# Patient Record
Sex: Female | Born: 1990 | Race: Black or African American | Hispanic: No | Smoking: Current every day smoker
Health system: Southern US, Community
[De-identification: ages and names within clinical notes are randomized; demographics above are authoritative.]

---

## 2014-01-09 ENCOUNTER — Emergency Department (HOSPITAL_COMMUNITY): Payer: No Typology Code available for payment source

## 2014-01-09 ENCOUNTER — Emergency Department (HOSPITAL_COMMUNITY)
Admission: EM | Admit: 2014-01-09 | Discharge: 2014-01-09 | Disposition: A | Discharge status: Home / Self Care | Payer: Self-pay | Attending: Emergency Medicine | Admitting: Emergency Medicine

## 2014-01-09 ENCOUNTER — Encounter (HOSPITAL_COMMUNITY): Payer: Self-pay | Admitting: Emergency Medicine

## 2014-01-09 DIAGNOSIS — Y998 Other external cause status: Secondary | ICD-10-CM | POA: Insufficient documentation

## 2014-01-09 DIAGNOSIS — Z720 Tobacco use: Secondary | ICD-10-CM | POA: Insufficient documentation

## 2014-01-09 DIAGNOSIS — S0083XA Contusion of other part of head, initial encounter: Secondary | ICD-10-CM | POA: Insufficient documentation

## 2014-01-09 DIAGNOSIS — Y92410 Unspecified street and highway as the place of occurrence of the external cause: Secondary | ICD-10-CM | POA: Insufficient documentation

## 2014-01-09 DIAGNOSIS — S3992XA Unspecified injury of lower back, initial encounter: Secondary | ICD-10-CM | POA: Insufficient documentation

## 2014-01-09 DIAGNOSIS — Y9389 Activity, other specified: Secondary | ICD-10-CM | POA: Insufficient documentation

## 2014-01-09 DIAGNOSIS — S199XXA Unspecified injury of neck, initial encounter: Secondary | ICD-10-CM | POA: Insufficient documentation

## 2014-01-09 MED ORDER — HYDROCODONE-ACETAMINOPHEN 5-325 MG PO TABS: 1.0000 | ORAL_TABLET | Freq: Four times a day (QID) | ORAL | Status: AC | PRN

## 2014-01-09 MED ORDER — SODIUM CHLORIDE 0.9 % IV BOLUS (SEPSIS)
1000.0000 mL | Freq: Once | INTRAVENOUS | Status: AC
Start: 2014-01-09 — End: 2014-01-09
  Administered 2014-01-09: 1000 mL via INTRAVENOUS

## 2014-01-09 MED ORDER — KETOROLAC TROMETHAMINE 30 MG/ML IJ SOLN
30.0000 mg | Freq: Once | INTRAMUSCULAR | Status: AC
  Administered 2014-01-09: 30 mg via INTRAVENOUS
  Filled 2014-01-09: qty 1

## 2014-01-09 MED ORDER — METOCLOPRAMIDE HCL 5 MG/ML IJ SOLN
10.0000 mg | Freq: Once | INTRAMUSCULAR | Status: AC
  Administered 2014-01-09: 10 mg via INTRAVENOUS
  Filled 2014-01-09: qty 2

## 2014-01-09 MED ORDER — CYCLOBENZAPRINE HCL 10 MG PO TABS: 10.0000 mg | ORAL_TABLET | Freq: Two times a day (BID) | ORAL | Status: AC | PRN

## 2014-01-09 MED ORDER — DIPHENHYDRAMINE HCL 50 MG/ML IJ SOLN
25.0000 mg | Freq: Once | INTRAMUSCULAR | Status: AC
  Administered 2014-01-09: 25 mg via INTRAVENOUS
  Filled 2014-01-09: qty 1
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
## 2014-01-09 NOTE — Discharge Instructions (Signed)
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
## 2017-02-03 ENCOUNTER — Ambulatory Visit: Payer: Self-pay | Admitting: Internal Medicine

## 2022-08-18 ENCOUNTER — Emergency Department: Payer: BLUE CROSS/BLUE SHIELD

## 2022-08-18 ENCOUNTER — Other Ambulatory Visit: Payer: Self-pay

## 2022-08-18 ENCOUNTER — Encounter: Payer: Self-pay | Admitting: Emergency Medicine

## 2022-08-18 ENCOUNTER — Emergency Department
Admission: EM | Admit: 2022-08-18 | Discharge: 2022-08-19 | Disposition: A | Discharge status: Home / Self Care | Payer: BLUE CROSS/BLUE SHIELD | Attending: Emergency Medicine | Admitting: Emergency Medicine

## 2022-08-18 DIAGNOSIS — R1909 Other intra-abdominal and pelvic swelling, mass and lump: Secondary | ICD-10-CM | POA: Insufficient documentation

## 2022-08-18 DIAGNOSIS — Z3A01 Less than 8 weeks gestation of pregnancy: Secondary | ICD-10-CM | POA: Insufficient documentation

## 2022-08-18 DIAGNOSIS — O4690 Antepartum hemorrhage, unspecified, unspecified trimester: Secondary | ICD-10-CM

## 2022-08-18 DIAGNOSIS — O209 Hemorrhage in early pregnancy, unspecified: Secondary | ICD-10-CM | POA: Insufficient documentation

## 2022-08-18 DIAGNOSIS — R1030 Lower abdominal pain, unspecified: Secondary | ICD-10-CM

## 2022-08-18 DIAGNOSIS — N9489 Other specified conditions associated with female genital organs and menstrual cycle: Secondary | ICD-10-CM | POA: Diagnosis not present

## 2022-08-18 LAB — CHLAMYDIA/NGC RT PCR (ARMC ONLY)
Chlamydia Tr: NOT DETECTED
N gonorrhoeae: NOT DETECTED

## 2022-08-18 LAB — URINALYSIS, COMPLETE (UACMP) WITH MICROSCOPIC
Bacteria, UA: NONE SEEN
Bilirubin Urine: NEGATIVE
Glucose, UA: NEGATIVE mg/dL
Ketones, ur: 20 mg/dL — AB
Leukocytes,Ua: NEGATIVE
Nitrite: NEGATIVE
Protein, ur: NEGATIVE mg/dL
Specific Gravity, Urine: 1.009 (ref 1.005–1.030)
pH: 5 (ref 5.0–8.0)

## 2022-08-18 LAB — CBC
HCT: 40.5 % (ref 36.0–46.0)
Hemoglobin: 13.1 g/dL (ref 12.0–15.0)
MCH: 27.9 pg (ref 26.0–34.0)
MCHC: 32.3 g/dL (ref 30.0–36.0)
MCV: 86.4 fL (ref 80.0–100.0)
Platelets: 213 10*3/uL (ref 150–400)
RBC: 4.69 MIL/uL (ref 3.87–5.11)
RDW: 11.9 % (ref 11.5–15.5)
WBC: 9.2 10*3/uL (ref 4.0–10.5)
nRBC: 0 % (ref 0.0–0.2)

## 2022-08-18 LAB — WET PREP, GENITAL
Clue Cells Wet Prep HPF POC: NONE SEEN
Sperm: NONE SEEN
Trich, Wet Prep: NONE SEEN
WBC, Wet Prep HPF POC: <10 (ref <10)
Yeast Wet Prep HPF POC: NONE SEEN

## 2022-08-18 LAB — TYPE AND SCREEN

## 2022-08-18 LAB — BASIC METABOLIC PANEL
Anion gap: 9 (ref 5–15)
BUN: 12 mg/dL (ref 6–20)
CO2: 22 mmol/L (ref 22–32)
Calcium: 9.6 mg/dL (ref 8.9–10.3)
Chloride: 107 mmol/L (ref 98–111)
Creatinine, Ser: 0.84 mg/dL (ref 0.44–1.00)
GFR, Estimated: >60 mL/min (ref >60)
Glucose, Bld: 115 mg/dL — ABNORMAL HIGH (ref 70–99)
Potassium: 3.5 mmol/L (ref 3.5–5.1)
Sodium: 138 mmol/L (ref 135–145)

## 2022-08-18 LAB — POC URINE PREG, ED: Preg Test, Ur: NEGATIVE

## 2022-08-18 LAB — HCG, QUANTITATIVE, PREGNANCY: hCG, Beta Chain, Quant, S: 1713 m[IU]/mL — ABNORMAL HIGH (ref <5)

## 2022-08-18 LAB — ABO/RH: ABO/RH(D): O POS

## 2022-08-18 MED ORDER — ACETAMINOPHEN 500 MG PO TABS
1000.0000 mg | ORAL_TABLET | Freq: Once | ORAL | Status: AC
  Administered 2022-08-18: 1000 mg via ORAL
  Filled 2022-08-18: qty 2

## 2022-08-18 MED ORDER — RHO D IMMUNE GLOBULIN 1500 UNIT/2ML IJ SOSY
300.0000 ug | PREFILLED_SYRINGE | Freq: Once | INTRAMUSCULAR | Status: DC
  Filled 2022-08-18: qty 2

## 2022-08-18 NOTE — ED Notes (Signed)
Called [facility] to have images available for midwife to view

## 2022-08-18 NOTE — ED Provider Notes (Signed)
[facility] Provider Note    Event Date/Time   First MD Initiated Contact with Patient 08/18/22 1829     (approximate)   History   Vaginal Bleeding   HPI  Ashlie Lindle is a 32 y.o. female   Past medical history of fibroids status post abdominal myomectomy in March 2024 presents emergency department with vaginal bleeding and lower abdominal cramping pain starting today, associated nausea but no vomiting.  She is approximately [redacted] weeks pregnant by last menstrual period.  Has not had ultrasound yet.  She denies fevers, chills, dysuria, frequency, or changes in bowel movements, GI bleeding.    External Medical Documents Reviewed: MRI of the pelvis from January 2024 with large fibroid      Physical Exam   Triage Vital Signs: ED Triage Vitals  Enc Vitals Group     BP 08/18/22 1752 133/85     Pulse Rate 08/18/22 1752 74     Resp 08/18/22 1752 18     Temp 08/18/22 1752 99 F (37.2 C)     Temp Source 08/18/22 1752 Axillary     SpO2 08/18/22 1752 100 %     Weight --      Height --      Head Circumference --      Peak Flow --      Pain Score 08/18/22 1755 10     Pain Loc --      Pain Edu? --      Excl. in GC? --     Most recent vital signs: Vitals:   08/18/22 1752 08/18/22 2100  BP: 133/85 128/82  Pulse: 74 72  Resp: 18 18  Temp: 99 F (37.2 C) 98.9 F (37.2 C)  SpO2: 100% 99%    General: Awake, no distress.  CV:  Good peripheral perfusion.  Resp:  Normal effort.  Abd:  No distention.  Other:  Mild tenderness to palpation bilateral lower quadrants of the abdomen, suprapubic tenderness, no rigidity or guarding, nontoxic comfortable appearing with normal vital signs afebrile.  Pelvic exam shows some scant blood in the vaginal vault with no active bleeding from the cervical os.   ED Results / Procedures / Treatments   Labs (all labs ordered are listed, but only abnormal results are displayed) Labs Reviewed  BASIC METABOLIC PANEL -  Abnormal; Notable for the following components:      Result Value   Glucose, Bld 115 (*)    All other components within normal limits  HCG, QUANTITATIVE, PREGNANCY - Abnormal; Notable for the following components:   hCG, Beta Chain, Quant, S 1,713 (*)    All other components within normal limits  URINALYSIS, COMPLETE (UACMP) WITH MICROSCOPIC - Abnormal; Notable for the following components:   Color, Urine STRAW (*)    APPearance CLEAR (*)    Hgb urine dipstick LARGE (*)    Ketones, ur 20 (*)    All other components within normal limits  CHLAMYDIA/NGC RT PCR ([facility] ONLY)            WET PREP, GENITAL  CBC  POC URINE PREG, ED  ABO/RH  TYPE AND SCREEN     I ordered and reviewed the above labs they are notable for beta hCG is 1700, no bacteria in the urine, normal H&H.  Rh+.    PROCEDURES:  Critical Care performed: No  Procedures   MEDICATIONS ORDERED IN ED: Medications  acetaminophen (TYLENOL) tablet 1,000 mg (1,000 mg Oral Given 08/18/22 2050)  IMPRESSION / MDM / ASSESSMENT AND PLAN / ED COURSE  I reviewed the triage vital signs and the nursing notes.                                Patient's presentation is most consistent with acute presentation with potential threat to life or bodily function.  Differential diagnosis includes, but is not limited to, ectopic pregnancy, threatened miscarriage, miscarriage, retained products, ovarian torsion, fibroid uterus, considered but less likely other surgical abdominal pathologies like appendicitis   The patient is on the cardiac monitor to evaluate for evidence of arrhythmia and/or significant heart rate changes.  MDM: Patient early pregnancy with vaginal bleeding and cramping abdominal pain concern for threatened miscarriage, miscarriage, ectopic pregnancy.  Get transvaginal ultrasound.  Rh+ no RhoGAM.  No fever, no increased white blood cell count, doubt appendicitis in the setting of early pregnancy with vaginal bleeding  and cramping pain.   --- Radiology called me Dr. Theresia Bough telling me of a adnexal mass concerning for ectopic pregnancy with surrounding free fluid that looks to be either a ruptured hemorrhagic cyst, but given her pregnancy history and symptomatology there is a concern that this may represent a a ruptured ectopic pregnancy as well.  Gynecology consulted 10:52 PM. Pt remains stable.         FINAL CLINICAL IMPRESSION(S) / ED DIAGNOSES   Final diagnoses:  Vaginal bleeding in pregnancy  Lower abdominal pain  Adnexal mass     Rx / DC Orders   ED Discharge Orders     None        Note:  This document was prepared using Dragon voice recognition software and may include unintentional dictation errors.    Pilar Jarvis, MD 08/18/22 2252

## 2022-08-18 NOTE — ED Triage Notes (Signed)
Patient to ED via POV for vaginal bleeding. Patient approx [redacted] weeks pregnant. States she noticed the blood when wiping- dark red in color. Only one episode. Last period 07/16/22. Had bilateral ooporectomy on 05/09/22.

## 2022-08-18 NOTE — ED Notes (Signed)
Called lab at this time for add on hCG

## 2022-08-19 NOTE — Discharge Instructions (Signed)
As we discussed, suspicion that you had a cyst rupture on the left side.  You will need to follow-up with OB/GYN in the next few days for recheck.  Today, your hCG (blood pregnancy number) was 1713, they will need to repeat this test for comparison  If you develop any severely worsening pain, vaginal bleeding or other worsening symptoms in the meantime the please return to the ED  Use Tylenol for pain and fevers.  Up to 1000 mg per dose, up to 4 times per day.  Do not take more than 4000 mg of Tylenol/acetaminophen within 24 hours.Marland Kitchen

## 2022-08-19 NOTE — ED Provider Notes (Signed)
Patient received in signout.  G1 at about [redacted] weeks gestation by LMP presents with LLQ abdominal pain.  Imaging reviewed and OB/GYN consulted.  OB/GYN suspects a hemorrhagic cyst.  I reevaluate the patient and she has soft and benign abdomen.  We discussed close OB/GYN follow-up and return precautions.  She is suitable for outpatient management.   Delton Prairie, MD 08/19/22 [phone]
